# Patient Record
Sex: Female | Born: 2005 | Hispanic: Yes | Marital: Single
Health system: Southern US, Community
[De-identification: ages and names within clinical notes are randomized; demographics above are authoritative.]

## PROBLEM LIST (undated history)

## (undated) DIAGNOSIS — J45909 Unspecified asthma, uncomplicated: Secondary | ICD-10-CM

---
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
## 2024-02-23 IMAGING — DX DG CHEST 2V
2 series · 2 of 2 positions shown · non-contrast
Comparison: None.

CLINICAL DATA: Cough.

EXAM:
CHEST - 2 VIEW

[chest pa]
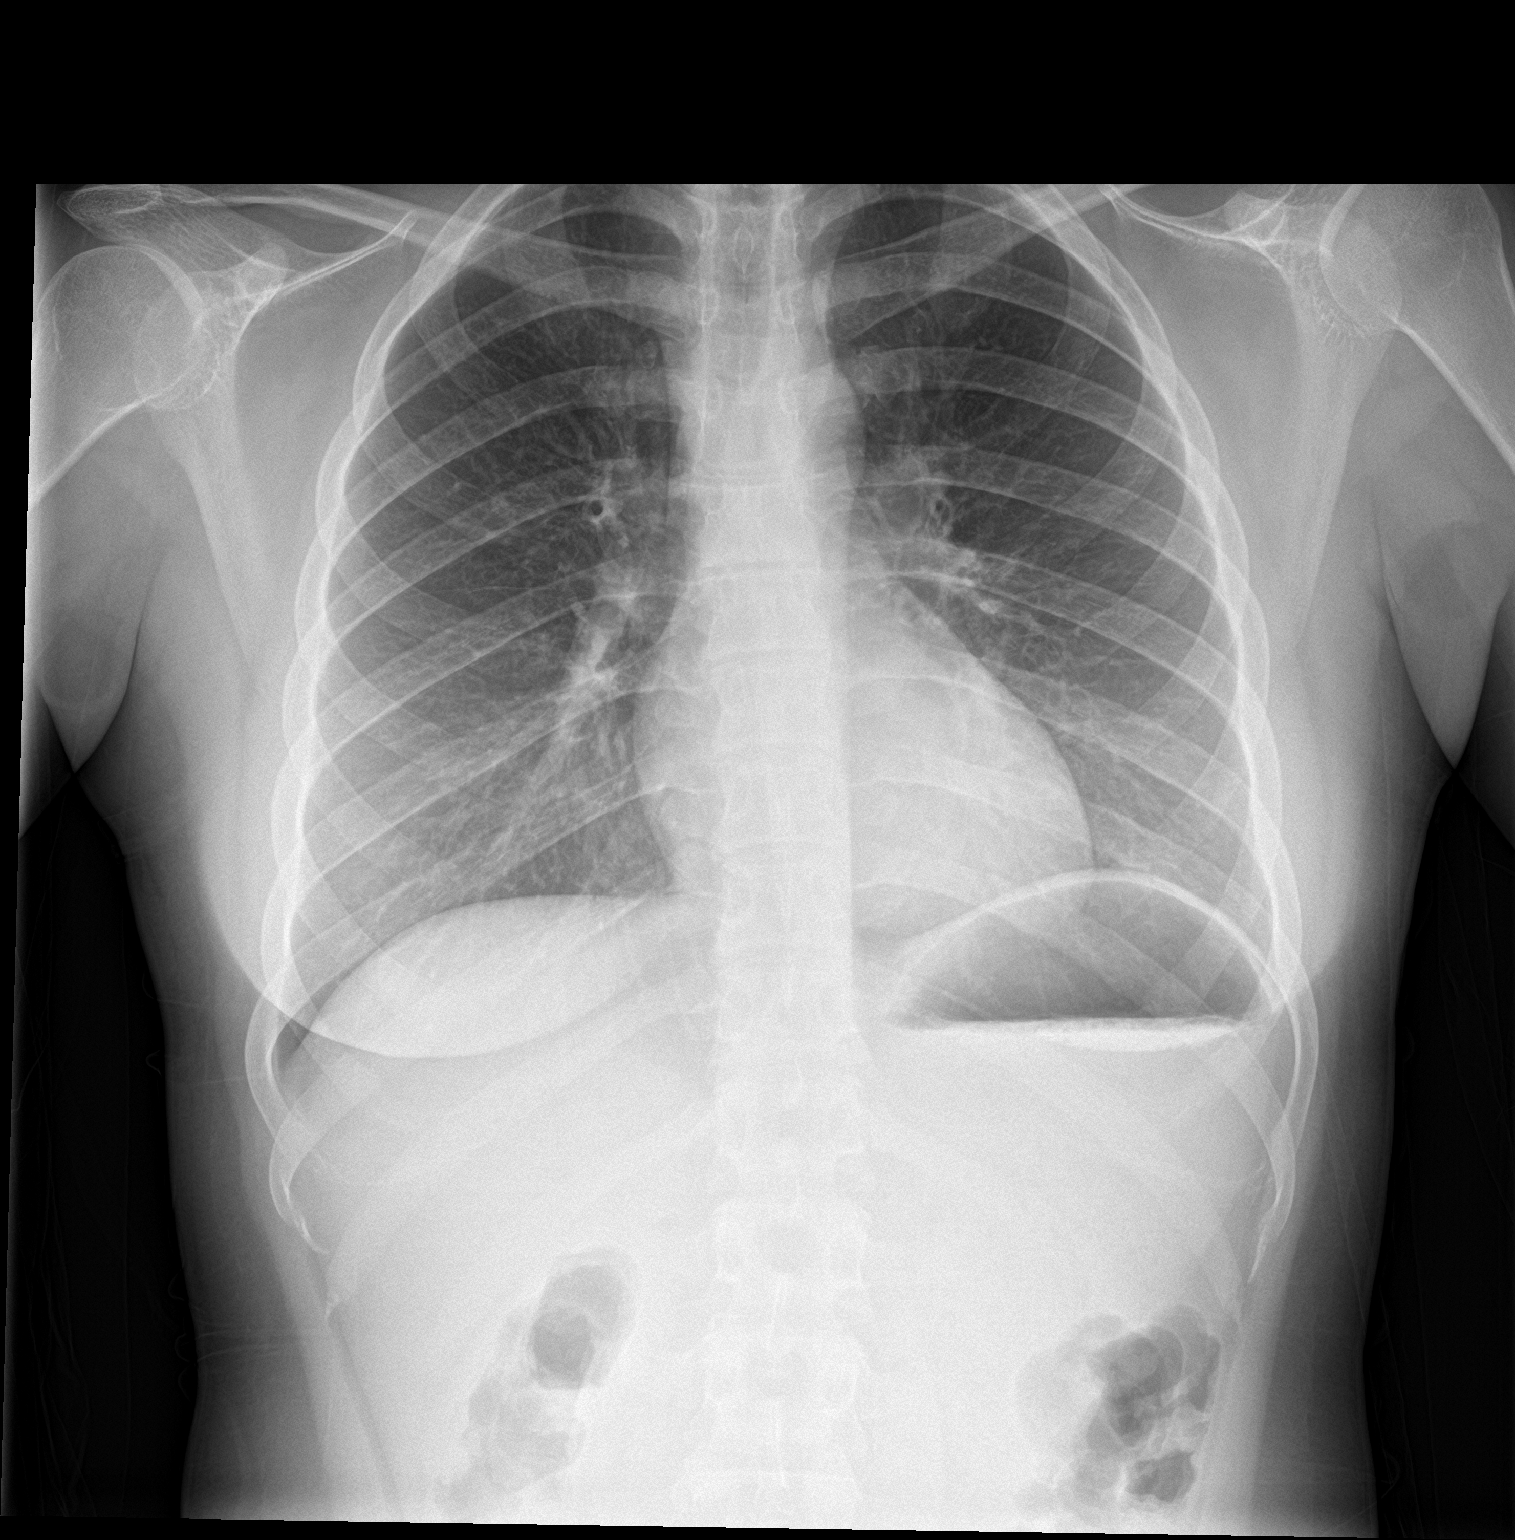

[chest lat]
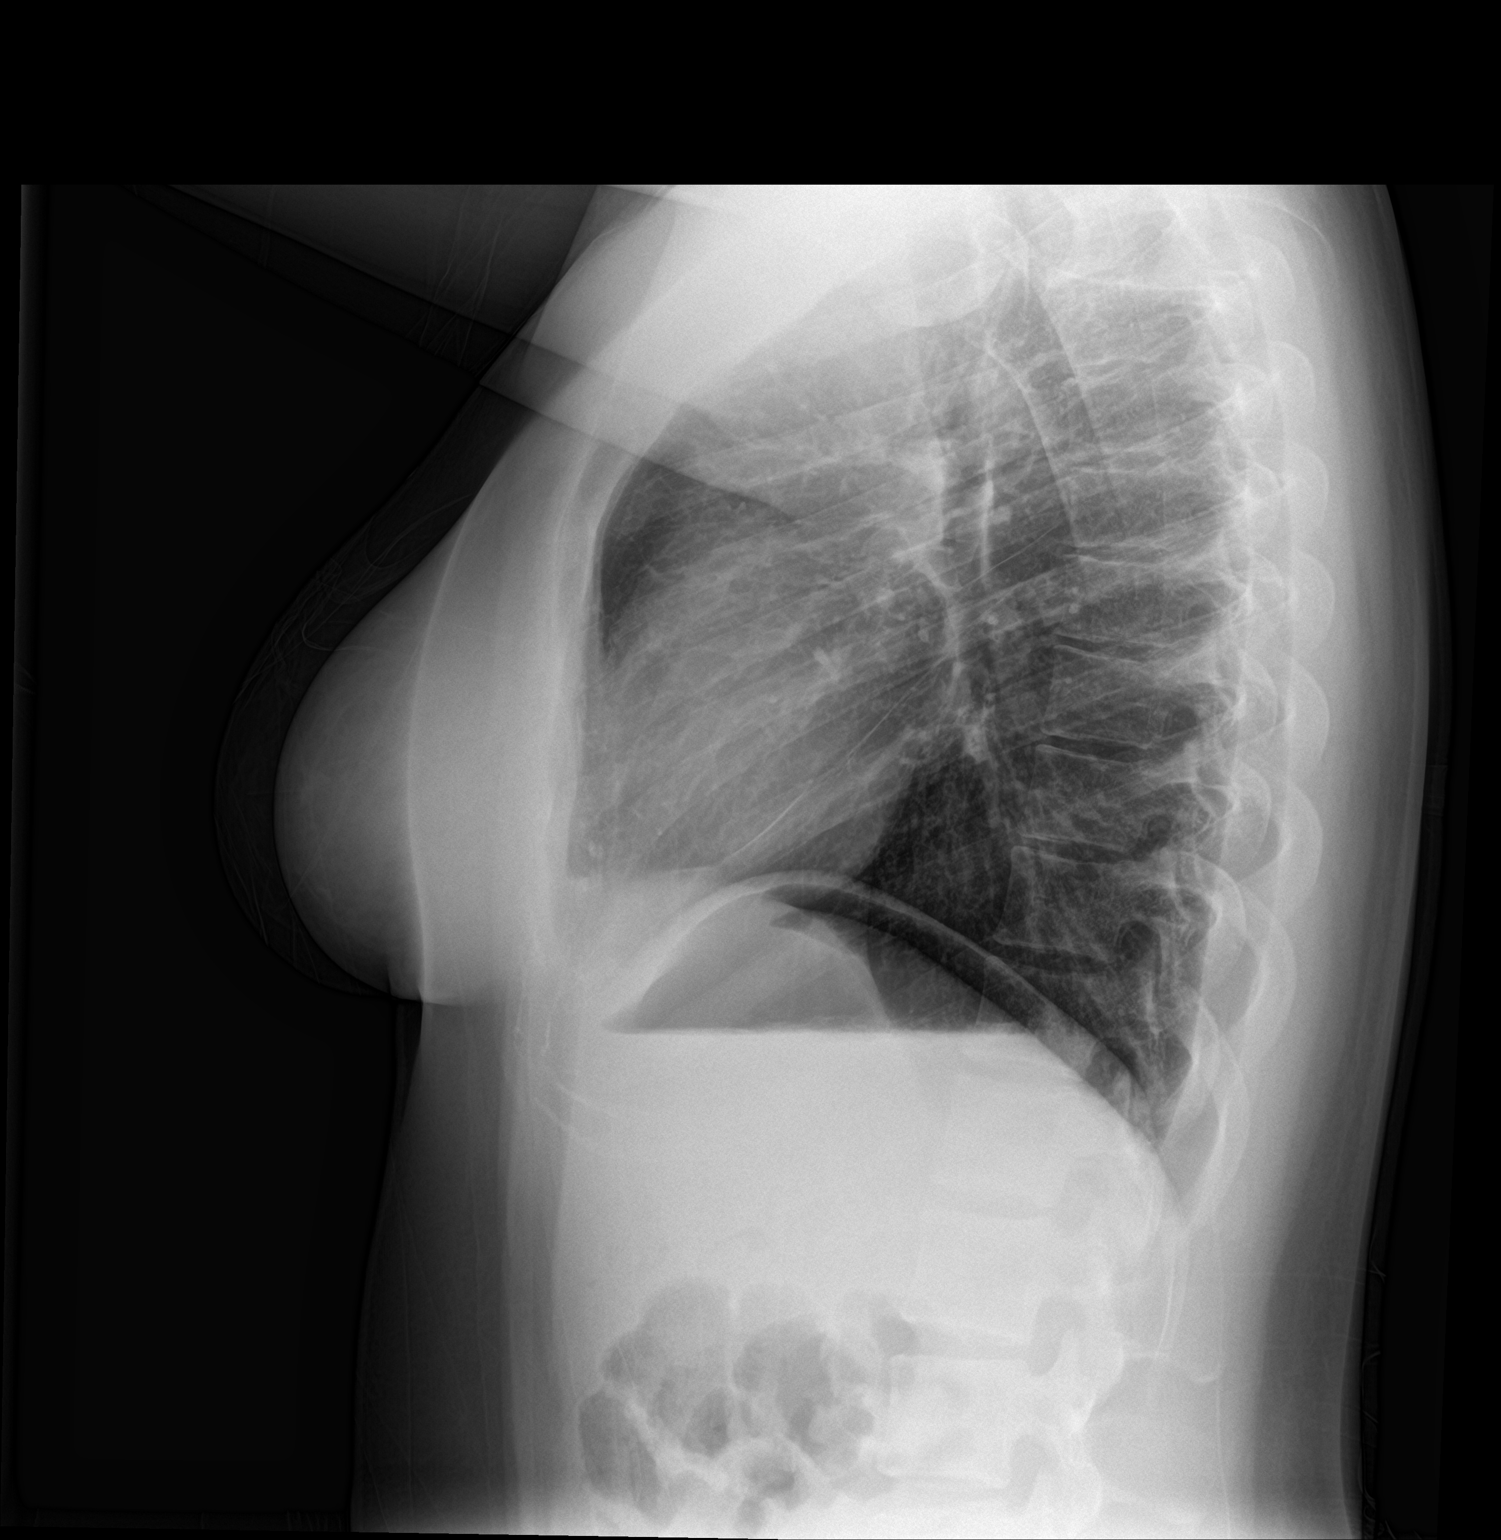

[2 of 2 positions shown; findings below may reference images not displayed]

FINDINGS: The heart size and mediastinal contours are within normal limits.
Both lungs are clear. The visualized skeletal structures are
unremarkable.
IMPRESSION: No active cardiopulmonary disease.
